# Patient Record
Sex: Female | Born: 1995 | Race: Black or African American | Hispanic: No | Marital: Single | Smoking: Never smoker
Health system: Southern US, Community
[De-identification: ages and names within clinical notes are randomized; demographics above are authoritative.]

---
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
## 2017-08-30 ENCOUNTER — Emergency Department (HOSPITAL_BASED_OUTPATIENT_CLINIC_OR_DEPARTMENT_OTHER): Payer: Medicaid Other

## 2017-08-30 ENCOUNTER — Encounter (HOSPITAL_BASED_OUTPATIENT_CLINIC_OR_DEPARTMENT_OTHER): Payer: Self-pay | Admitting: Emergency Medicine

## 2017-08-30 ENCOUNTER — Other Ambulatory Visit: Payer: Self-pay

## 2017-08-30 ENCOUNTER — Emergency Department (HOSPITAL_BASED_OUTPATIENT_CLINIC_OR_DEPARTMENT_OTHER)
Admission: EM | Admit: 2017-08-30 | Discharge: 2017-08-30 | Disposition: A | Discharge status: Home / Self Care | Payer: Medicaid Other | Attending: Emergency Medicine | Admitting: Emergency Medicine

## 2017-08-30 DIAGNOSIS — M25561 Pain in right knee: Secondary | ICD-10-CM | POA: Diagnosis present

## 2017-08-30 MED ORDER — NAPROXEN 500 MG PO TABS: 500.0000 mg | ORAL_TABLET | Freq: Two times a day (BID) | ORAL | 0 refills | Status: AC

## 2017-08-30 MED FILL — NAPROXEN 500 MG TABLET: 500 | 15 days supply | Qty: 30 | Fill #0

## 2017-08-30 NOTE — ED Provider Notes (Signed)
[facility] EMERGENCY DEPARTMENT Provider Note   CSN: [id number] Arrival date & time: 08/30/17  1030     History   Chief Complaint No chief complaint on file.   HPI Brianna Hood is a 22 y.o. female who presents the emergency department today for right knee pain times 2 months.  Patient states that over the last 2 months she notes anterior and medial joint pain that she describes as sharp, achy in nature is most severe when walking upstairs.  She notes the pain typically lasts for approximately 15 minutes and is relieved with rest.  She notes periods where she feels her knee is clicking/catching but never gives way on her.  She has not taken anything for her symptoms.  She denies over-the-counter medication, rest, ice, bracing or elevation.  Patient does note that she has 2 young children that she constantly bends down and picks up which she thinks may be aggravating her symptoms.  She denies any associated trauma, fever, overlying erythema, joint swelling, numbness/tingling/weakness.  HPI  History reviewed. No pertinent past medical history.  There are no active problems to display for this patient.   History reviewed. No pertinent surgical history.  OB History    No data available       Home Medications    Prior to Admission medications   Medication Sig Start Date End Date Taking? Authorizing Provider  Prenatal Vit-Fe Fumarate-FA (PRENATAL COMPLETE) 14-0.4 MG TABS Take 1 tablet by mouth daily. 10/26/14   Raymon Mutton, PA-C    Family History History reviewed. No pertinent family history.  Social History Social History   Tobacco Use  . Smoking status: Never Smoker  Substance Use Topics  . Alcohol use: No  . Drug use: No     Allergies   Patient has no known allergies.   Review of Systems Review of Systems  Musculoskeletal: Positive for arthralgias. Negative for gait problem and joint swelling.  Skin: Negative for color change and wound.    Neurological: Negative for weakness and numbness.  All other systems reviewed and are negative.    Physical Exam Updated Vital Signs BP 121/73 (BP Location: Left Arm)   Pulse 66   Temp 98.2 F (36.8 C) (Oral)   Resp 18   Ht 5\' 7"  (1.702 m)   Wt 92.5 kg (204 lb)   SpO2 100%   BMI 31.95 kg/m   Physical Exam  Constitutional: She appears well-developed and well-nourished.  HENT:  Head: Normocephalic and atraumatic.  Right Ear: External ear normal.  Left Ear: External ear normal.  Eyes: Conjunctivae are normal. Right eye exhibits no discharge. Left eye exhibits no discharge. No scleral icterus.  Cardiovascular:  Pulses:      Dorsalis pedis pulses are 2+ on the right side.       Posterior tibial pulses are 2+ on the right side.  No lower extremity edema or swelling bilaterally.  No tenderness palpation of the calves.  Pulmonary/Chest: Effort normal. No respiratory distress.  Musculoskeletal:  Right knee: Appearance normal. No obvious deformity. No skin swelling, erythema, heat, fluctuance or break of the skin. TTP over anterior and medial joint line. Active and passive flexion (70 degree's) and extension (180 degree's) intact without crepitus. Pain noted on maximal flexion past 90 degree's active and passively. Negative Lachman's test. Negative anterior/poster drawer bilaterally. Positive medial McMurray's test. Negative ballottement test. No varus or valgus laxity or locking. No TTP of hips or ankles. Compartments soft. Neurovascularly intact distally to site of  injury.  Neurological: She is alert. She has normal strength. She displays no atrophy. No sensory deficit.  Skin: Skin is warm and dry. Capillary refill takes less than 2 seconds. She is not diaphoretic. No erythema. No pallor.  No overlying joint erythema or heat.  Psychiatric: She has a normal mood and affect.  Nursing note and vitals reviewed.    ED Treatments / Results  Labs (all labs ordered are listed, but only  abnormal results are displayed) Labs Reviewed - No data to display  EKG  EKG Interpretation None       Radiology Dg Knee Complete 4 Views Right  Result Date: 08/30/2017 CLINICAL DATA:  Five months of right knee pain with no known injury. Increasing severity since last night. EXAM: RIGHT KNEE - COMPLETE 4+ VIEW COMPARISON:  None in PACs FINDINGS: The bones are subjectively adequately mineralized. There is no acute or healing fracture. The joint spaces are well maintained. There is mild beaking of the medial tibial spine. IMPRESSION: There is no acute bony abnormality of the right knee. Minimal degenerative change manifested by beaking of the medial tibial spine is observed. Electronically Signed   By: David  [place]azil[place] M.D.   On: 08/30/2017 11:17    Procedures Procedures (including critical care time)  Medications Ordered in ED Medications - No data to display   Initial Impression / Assessment and Plan / ED Course  I have reviewed the triage vital signs and the nursing notes.  Pertinent labs & imaging results that were available during my care of the patient were reviewed by me and considered in my medical decision making (see chart for details).     22 y.o. female with anterior and medial knee pain worse when walking up stairs, as well as clicking and catching of the knee when ambulating, No trauma, fever, or paresthesia's. Exam is concerning for meniscus injury with + McMurray's test medial. Patient exam not concerning for septic joint as the patient is without fever, decreased rom, overlying erythema or joint swelling. Do not suspect DVT given patient's clinical picture.  Patient is neurovascular intact.  No history of dislocation/relocation injury that make me concern for vascular injury.  Xray no fracture or healing fracture.  No evidence of osteoarthritis.  There is mild breakdown of the medial tibial spine.  Placed the patient in knee sleeve and recommend conservative therapy  including PRICE therapy. I advised the patient to follow-up with PCP vs ortho this week. Specific return precautions discussed. Time was given for all questions to be answered. The patient verbalized understanding and agreement with plan. The patient appears safe for discharge home.  Final Clinical Impressions(s) / ED Diagnoses   Final diagnoses:  Acute pain of right knee    ED Discharge Orders        Ordered    naproxen (NAPROSYN) 500 MG tablet  2 times daily     08/30/17 1126       Princella PellegriniMaczis, Niley Helbig M, PA-C 08/30/17 1127    Charlynne PanderYao, David Hsienta, MD 08/30/17 1539

## 2017-08-30 NOTE — ED Triage Notes (Signed)
Patient states that she has had pain to her Right knee for 2 months. Last night it was worse

## 2017-08-30 NOTE — Discharge Instructions (Signed)
Please read and follow all provided instructions.  You have been seen today for right knee pain  Tests performed today include: An x-ray of the affected area - does NOT show any broken bones or dislocations.  Is possible your symptoms may be related to a meniscus injury.  Is unclear if this is a tear or minor injury at this time. Vital signs. See below for your results today.   Home care instructions: -- *PRICE in the first 24-48 hours after injury: Protect (with brace, splint, sling), if given by your provider Rest Ice- Do not apply ice pack directly to your skin, place towel or similar between your skin and ice/ice pack. Apply ice for 20 min, then remove for 40 min while awake Compression- Wear brace, elastic bandage, splint as directed by your provider Elevate affected extremity above the level of your heart when not walking around for the first 24-48 hours   Take naproxen as directed with food.  Please do not use if you are breast-feeding or think he may be pregnant.  Follow-up instructions: Please follow-up with your primary care provider or the provided orthopedic physician (bone specialist) if you continue to have significant pain in 1 week. In this case you may have a more severe injury that requires further care.   Return instructions:  Please return if your toes or feet are numb or tingling, appear gray or blue, or you have severe pain (also elevate the leg and loosen splint or wrap if you were given one) Please return to the Emergency Department if you experience worsening symptoms.  Please return if you have any other emergent concerns. Additional Information:  Your vital signs today were: BP 121/73 (BP Location: Left Arm)    Pulse 66    Temp 98.2 F (36.8 C) (Oral)    Resp 18    Ht 5\' 7"  (1.702 m)    Wt 92.5 kg (204 lb)    SpO2 100%    BMI 31.95 kg/m  If your blood pressure (BP) was elevated above 135/85 this visit, please have this repeated by your doctor within one  month. ---------------

## 2017-11-02 ENCOUNTER — Other Ambulatory Visit (INDEPENDENT_AMBULATORY_CARE_PROVIDER_SITE_OTHER): Payer: Medicaid Other

## 2017-11-02 DIAGNOSIS — Z111 Encounter for screening for respiratory tuberculosis: Secondary | ICD-10-CM | POA: Diagnosis not present

## 2017-11-02 NOTE — Progress Notes (Signed)
Tuberculin skin test applied to left ventral forearm.  Patient informed to schedule appt for nurse visit in 48-72 hours to have site read.  Jay'A R Adysen Raphael, RMA  

## 2017-11-02 NOTE — Patient Instructions (Signed)
Please return to the clinic in 48-72 hours to have ppd read  Tuberculin purified protein derivative, PPD injection What is this medicine? TUBERCULIN PURIFIED PROTEIN DERIVATIVE, PPD is a test used to detect if you have a tuberculosis infection. It will not cause tuberculosis infection. This medicine may be used for other purposes; ask your health care provider or pharmacist if you have questions. COMMON BRAND NAME(S): Aplisol, Tubersol What should I tell my health care provider before I take this medicine? They need to know if you have any of these conditions: -diabetes -HIV or AIDS -immune system problems -infection (especially a virus infection such as chickenpox, cold sores, or herpes) -kidney disease -malnutrition -an unusual or allergic reaction to tuberculin purified protein derivative, PPD, other medicines, foods, dyes, or preservatives -pregnant or trying to get pregnant -breast-feeding How should I use this medicine? This medicine is for injection in the skin. It is given by a health care professional in a hospital or clinic setting. Talk to your pediatrician regarding the use of this medicine in children. While this drug may be prescribed in children, precautions do apply. Overdosage: If you think you have taken too much of this medicine contact a poison control center or emergency room at once. NOTE: This medicine is only for you. Do not share this medicine with others. What if I miss a dose? It is important not to miss your dose. Call your doctor or health care professional if you are unable to keep an appointment. What may interact with this medicine? -adalimumab -anakinra -etanercept -infliximab -live virus vaccines -medicines to treat cancer -steroid medicines like prednisone or cortisone This list may not describe all possible interactions. Give your health care provider a list of all the medicines, herbs, non-prescription drugs, or dietary supplements you use. Also  tell them if you smoke, drink alcohol, or use illegal drugs. Some items may interact with your medicine. What should I watch for while using this medicine? See your health care provider as directed. This medicine does not protect against tuberculosis. What side effects may I notice from receiving this medicine? Side effects that you should report to your doctor or health care professional as soon as possible: -allergic reactions like skin rash, itching or hives, swelling of the face, lips, or tongue -breathing problems Side effects that usually do not require medical attention (report to your doctor or health care professional if they continue or are bothersome): -bruising -pain, redness, or irritation at site where injected This list may not describe all possible side effects. Call your doctor for medical advice about side effects. You may report side effects to FDA at 1-800-FDA-1088. Where should I keep my medicine? This drug is given in a hospital or clinic and will not be stored at home. NOTE: This sheet is a summary. It may not cover all possible information. If you have questions about this medicine, talk to your doctor, pharmacist, or health care provider.  2018 Elsevier/Gold Standard (2015-07-24 11:42:34)

## 2017-11-04 ENCOUNTER — Other Ambulatory Visit (INDEPENDENT_AMBULATORY_CARE_PROVIDER_SITE_OTHER): Payer: Medicaid Other

## 2018-07-04 ENCOUNTER — Encounter (HOSPITAL_BASED_OUTPATIENT_CLINIC_OR_DEPARTMENT_OTHER): Payer: Self-pay | Admitting: *Deleted

## 2018-07-04 ENCOUNTER — Emergency Department (HOSPITAL_BASED_OUTPATIENT_CLINIC_OR_DEPARTMENT_OTHER)
Admission: EM | Admit: 2018-07-04 | Discharge: 2018-07-04 | Disposition: A | Discharge status: Home / Self Care | Payer: BLUE CROSS/BLUE SHIELD | Attending: Emergency Medicine | Admitting: Emergency Medicine

## 2018-07-04 ENCOUNTER — Other Ambulatory Visit: Payer: Self-pay

## 2018-07-04 ENCOUNTER — Emergency Department (HOSPITAL_BASED_OUTPATIENT_CLINIC_OR_DEPARTMENT_OTHER): Payer: BLUE CROSS/BLUE SHIELD

## 2018-07-04 DIAGNOSIS — R1084 Generalized abdominal pain: Secondary | ICD-10-CM | POA: Insufficient documentation

## 2018-07-04 DIAGNOSIS — Z79899 Other long term (current) drug therapy: Secondary | ICD-10-CM | POA: Diagnosis not present

## 2018-07-04 DIAGNOSIS — B373 Candidiasis of vulva and vagina: Secondary | ICD-10-CM

## 2018-07-04 DIAGNOSIS — R1012 Left upper quadrant pain: Secondary | ICD-10-CM | POA: Diagnosis present

## 2018-07-04 DIAGNOSIS — N898 Other specified noninflammatory disorders of vagina: Secondary | ICD-10-CM | POA: Diagnosis not present

## 2018-07-04 DIAGNOSIS — O2341 Unspecified infection of urinary tract in pregnancy, first trimester: Secondary | ICD-10-CM

## 2018-07-04 DIAGNOSIS — O23591 Infection of other part of genital tract in pregnancy, first trimester: Secondary | ICD-10-CM | POA: Insufficient documentation

## 2018-07-04 DIAGNOSIS — Z331 Pregnant state, incidental: Secondary | ICD-10-CM | POA: Diagnosis not present

## 2018-07-04 DIAGNOSIS — O219 Vomiting of pregnancy, unspecified: Secondary | ICD-10-CM

## 2018-07-04 DIAGNOSIS — B3731 Acute candidiasis of vulva and vagina: Secondary | ICD-10-CM

## 2018-07-04 LAB — COMPREHENSIVE METABOLIC PANEL
ALT: 10 U/L (ref 0–44)
AST: 12 U/L — ABNORMAL LOW (ref 15–41)
Albumin: 3.8 g/dL (ref 3.5–5.0)
Alkaline Phosphatase: 75 U/L (ref 38–126)
Anion gap: 7 (ref 5–15)
BUN: 9 mg/dL (ref 6–20)
CALCIUM: 9 mg/dL (ref 8.9–10.3)
CO2: 20 mmol/L — AB (ref 22–32)
Chloride: 106 mmol/L (ref 98–111)
Creatinine, Ser: 0.66 mg/dL (ref 0.44–1.00)
GFR calc non Af Amer: >60 mL/min (ref >60)
Glucose, Bld: 85 mg/dL (ref 70–99)
POTASSIUM: 3.8 mmol/L (ref 3.5–5.1)
SODIUM: 133 mmol/L — AB (ref 135–145)
Total Bilirubin: 0.4 mg/dL (ref 0.3–1.2)
Total Protein: 7.5 g/dL (ref 6.5–8.1)

## 2018-07-04 LAB — CBC WITH DIFFERENTIAL/PLATELET
Abs Immature Granulocytes: 0.01 10*3/uL (ref 0.00–0.07)
Basophils Absolute: 0 10*3/uL (ref 0.0–0.1)
Basophils Relative: 0 %
EOS ABS: 0 10*3/uL (ref 0.0–0.5)
Eosinophils Relative: 0 %
HCT: 36.8 % (ref 36.0–46.0)
Hemoglobin: 11.5 g/dL — ABNORMAL LOW (ref 12.0–15.0)
Immature Granulocytes: 0 %
Lymphocytes Relative: 19 %
Lymphs Abs: 1.3 10*3/uL (ref 0.7–4.0)
MCH: 24.8 pg — ABNORMAL LOW (ref 26.0–34.0)
MCHC: 31.3 g/dL (ref 30.0–36.0)
MCV: 79.5 fL — ABNORMAL LOW (ref 80.0–100.0)
Monocytes Absolute: 0.4 10*3/uL (ref 0.1–1.0)
Monocytes Relative: 6 %
NEUTROS PCT: 75 %
NRBC: 0 % (ref 0.0–0.2)
Neutro Abs: 5.2 10*3/uL (ref 1.7–7.7)
Platelets: 181 10*3/uL (ref 150–400)
RBC: 4.63 MIL/uL (ref 3.87–5.11)
RDW: 14.7 % (ref 11.5–15.5)
WBC: 6.9 10*3/uL (ref 4.0–10.5)

## 2018-07-04 LAB — URINALYSIS, ROUTINE W REFLEX MICROSCOPIC
Bilirubin Urine: NEGATIVE
Glucose, UA: NEGATIVE mg/dL
Ketones, ur: NEGATIVE mg/dL
Nitrite: POSITIVE — AB
PH: 7 (ref 5.0–8.0)
Protein, ur: NEGATIVE mg/dL
SPECIFIC GRAVITY, URINE: 1.015 (ref 1.005–1.030)

## 2018-07-04 LAB — URINALYSIS, MICROSCOPIC (REFLEX)

## 2018-07-04 LAB — WET PREP, GENITAL
SPERM: NONE SEEN
Trich, Wet Prep: NONE SEEN

## 2018-07-04 LAB — PREGNANCY, URINE: Preg Test, Ur: POSITIVE — AB

## 2018-07-04 LAB — LIPASE, BLOOD: Lipase: 25 U/L (ref 11–51)

## 2018-07-04 LAB — HCG, QUANTITATIVE, PREGNANCY: hCG, Beta Chain, Quant, S: 61037 m[IU]/mL — ABNORMAL HIGH (ref <5)

## 2018-07-04 MED ORDER — CLOTRIMAZOLE 1 % VA CREA: 1.0000 | TOPICAL_CREAM | Freq: Every day | VAGINAL | 0 refills | Status: AC

## 2018-07-04 MED ORDER — CEPHALEXIN 500 MG PO CAPS: 500.0000 mg | ORAL_CAPSULE | Freq: Four times a day (QID) | ORAL | 0 refills | Status: AC

## 2018-07-04 NOTE — ED Provider Notes (Signed)
[facility] EMERGENCY DEPARTMENT Provider Note   CSN: [id number] Arrival date & time: 07/04/18  1543     History   Chief Complaint Chief Complaint  Patient presents with  . Abdominal Pain  . Emesis    HPI Brianna Hood is a 22 y.o. female.  22 year old female presents with complaint of left upper quadrant abdominal pain with nausea and vomiting x2 days as well as thick white vaginal discharge.  Abdominal pain is aching in nature, intermittent, nothing makes pain better or worse, does not radiate.  Patient denies vaginal itching or sores.  Last menstrual cycle reported to be greater than 4 weeks ago.  No other complaints or concerns.     History reviewed. No pertinent past medical history.  There are no active problems to display for this patient.   History reviewed. No pertinent surgical history.   OB History   No obstetric history on file.      Home Medications    Prior to Admission medications   Medication Sig Start Date End Date Taking? Authorizing Provider  cephALEXin (KEFLEX) 500 MG capsule Take 1 capsule (500 mg total) by mouth 4 (four) times daily for 5 days. 07/04/18 07/09/18  Jeannie Fend, PA-C  clotrimazole (GYNE-LOTRIMIN) 1 % vaginal cream Place 1 Applicatorful vaginally at bedtime for 7 days. 07/04/18 07/11/18  Jeannie Fend, PA-C  naproxen (NAPROSYN) 500 MG tablet Take 1 tablet (500 mg total) by mouth 2 (two) times daily. 08/30/17   Maczis, Elmer Sow, PA-C  Prenatal Vit-Fe Fumarate-FA (PRENATAL COMPLETE) 14-0.4 MG TABS Take 1 tablet by mouth daily. 10/26/14   Raymon Mutton, PA-C    Family History No family history on file.  Social History Social History   Tobacco Use  . Smoking status: Never Smoker  . Smokeless tobacco: Never Used  Substance Use Topics  . Alcohol use: No  . Drug use: No     Allergies   Patient has no known allergies.   Review of Systems Review of Systems  Constitutional: Negative for fever.  Respiratory:  Negative for shortness of breath.   Cardiovascular: Negative for chest pain.  Gastrointestinal: Positive for abdominal pain, nausea and vomiting. Negative for abdominal distention, constipation and diarrhea.  Genitourinary: Positive for vaginal discharge. Negative for dysuria, frequency and urgency.  Musculoskeletal: Negative for back pain.  Skin: Negative for rash and wound.  Allergic/Immunologic: Negative for immunocompromised state.  Hematological: Negative for adenopathy. Does not bruise/bleed easily.  Psychiatric/Behavioral: Negative for confusion.  All other systems reviewed and are negative.    Physical Exam Updated Vital Signs BP (!) 118/56 (BP Location: Right Arm)   Pulse 76   Temp 98.5 F (36.9 C) (Oral)   Resp 18   Ht 5\' 7"  (1.702 m)   Wt 92.5 kg   LMP 05/05/2018   SpO2 100%   BMI 31.95 kg/m   Physical Exam Vitals signs and nursing note reviewed. Exam conducted with a chaperone present.  Constitutional:      Appearance: She is well-developed.  HENT:     Head: Normocephalic and atraumatic.  Cardiovascular:     Rate and Rhythm: Normal rate and regular rhythm.     Heart sounds: Normal heart sounds. No murmur.  Pulmonary:     Effort: Pulmonary effort is normal.     Breath sounds: Normal breath sounds.  Abdominal:     Palpations: Abdomen is soft.     Tenderness: There is abdominal tenderness in the right lower quadrant, suprapubic area, left  upper quadrant and left lower quadrant. There is no right CVA tenderness or left CVA tenderness.  Genitourinary:    Vagina: Vaginal discharge present.     Cervix: No friability.     Uterus: Normal.      Adnexa: Right adnexa normal and left adnexa normal.       Right: No tenderness.         Left: No tenderness.       Comments: Moderate thick white/yellow dc present.  Skin:    General: Skin is warm and dry.  Neurological:     Mental Status: She is alert.      ED Treatments / Results  Labs (all labs ordered are  listed, but only abnormal results are displayed) Labs Reviewed  WET PREP, GENITAL - Abnormal; Notable for the following components:      Result Value   Yeast Wet Prep HPF POC PRESENT (*)    Clue Cells Wet Prep HPF POC PRESENT (*)    WBC, Wet Prep HPF POC MANY (*)    All other components within normal limits  URINALYSIS, ROUTINE W REFLEX MICROSCOPIC - Abnormal; Notable for the following components:   Hgb urine dipstick TRACE (*)    Nitrite POSITIVE (*)    Leukocytes, UA TRACE (*)    All other components within normal limits  PREGNANCY, URINE - Abnormal; Notable for the following components:   Preg Test, Ur POSITIVE (*)    All other components within normal limits  URINALYSIS, MICROSCOPIC (REFLEX) - Abnormal; Notable for the following components:   Bacteria, UA MANY (*)    All other components within normal limits  COMPREHENSIVE METABOLIC PANEL - Abnormal; Notable for the following components:   Sodium 133 (*)    CO2 20 (*)    AST 12 (*)    All other components within normal limits  CBC WITH DIFFERENTIAL/PLATELET - Abnormal; Notable for the following components:   Hemoglobin 11.5 (*)    MCV 79.5 (*)    MCH 24.8 (*)    All other components within normal limits  HCG, QUANTITATIVE, PREGNANCY - Abnormal; Notable for the following components:   hCG, Beta Chain, Quant, S 61,037 (*)    All other components within normal limits  LIPASE, BLOOD  GC/CHLAMYDIA PROBE AMP ([place]) NOT AT [facility]    EKG None  Radiology [place] Ob Comp < 14 Wks  Result Date: 07/04/2018 CLINICAL DATA:  Abdomen pain for 3 days. EXAM: OBSTETRIC <14 WK US AND TRANSVAGINAL OB US TECHNIQUE: Both transabdominal and transvaginal ultrasound examinations were performed for complete evaluation of the gestation as well as the maternal uterus, adnexal regions, and pelvic cul-de-sac. Transvaginal technique was performed to assess early pregnancy. COMPARISON:  None. FINDINGS: Intrauterine gestational sac: Single Yolk sac:   Visualized. Embryo:  Visualized. Cardiac Activity: Visualized. Heart Rate: 116 bpm MSD:   mm    w     d CRL:  5.2 mm   6 w   2 d                  US EDC: March 04, 2019 Subchorionic hemorrhage:  Mild subchorionic hemorrhage is noted. Maternal uterus/adnexae: Corpus luteal cyst in the right ovary. The left ovary is normal. There is mild increased vascularity surrounding left ovary. Moderate free fluid noted. IMPRESSION: Single live intrauterine pregnancy measuring to 6 weeks and 2 days. There is mild subchorionic hemorrhage. Moderate free fluid is identified in the pelvis. Electronically Signed   By: Scherry RanWei-Chen  Juel BurrowLin M.D.   On: 07/04/2018 19:32   [place] Ob Transvaginal  Result Date: 07/04/2018 CLINICAL DATA:  Abdomen pain for 3 days. EXAM: OBSTETRIC <14 WK US AND TRANSVAGINAL OB US TECHNIQUE: Both transabdominal and transvaginal ultrasound examinations were performed for complete evaluation of the gestation as well as the maternal uterus, adnexal regions, and pelvic cul-de-sac. Transvaginal technique was performed to assess early pregnancy. COMPARISON:  None. FINDINGS: Intrauterine gestational sac: Single Yolk sac:  Visualized. Embryo:  Visualized. Cardiac Activity: Visualized. Heart Rate: 116 bpm MSD:   mm    w     d CRL:  5.2 mm   6 w   2 d                  US EDC: March 04, 2019 Subchorionic hemorrhage:  Mild subchorionic hemorrhage is noted. Maternal uterus/adnexae: Corpus luteal cyst in the right ovary. The left ovary is normal. There is mild increased vascularity surrounding left ovary. Moderate free fluid noted. IMPRESSION: Single live intrauterine pregnancy measuring to 6 weeks and 2 days. There is mild subchorionic hemorrhage. Moderate free fluid is identified in the pelvis. Electronically Signed   By: Sherian ReinWei-Chen  Lin M.D.   On: 07/04/2018 19:32    Procedures Procedures (including critical care time)  Medications Ordered in ED Medications - No data to display   Initial Impression / Assessment and  Plan / ED Course  I have reviewed the triage vital signs and the nursing notes.  Pertinent labs & imaging results that were available during my care of the patient were reviewed by me and considered in my medical decision making (see chart for details).  Clinical Course as of Jul 04 2000  Tue Jul 04, 2018  [id number] 22 year old female with complaint of left upper quadrant abdominal pain with nausea and vomiting and thick white vaginal discharge.  Patient was found to be pregnant, no pelvic tenderness on exam, thick white discharge present.  Wet prep is positive for yeast and clue cells.  Urinalysis concerning for UTI, CBC and CMP unremarkable, quant correlates with pregnancy, gestational age [redacted] weeks and 2 days found on ultrasound showing viable IUP.  Patient will be treated with Keflex for her UTI, clotrimazole for her yeast infection.  Recommend follow-up with OB for routine prenatal care and recheck of the BV.   [LM]    Clinical Course User Index [LM] Jeannie FendMurphy, Anistyn Graddy A, PA-C   Final Clinical Impressions(s) / ED Diagnoses   Final diagnoses:  Pregnant state, incidental  Generalized abdominal pain  Nausea and vomiting in pregnancy  Urinary tract infection in mother during first trimester of pregnancy  Vaginal yeast infection    ED Discharge Orders         Ordered    cephALEXin (KEFLEX) 500 MG capsule  4 times daily     07/04/18 1918    clotrimazole (GYNE-LOTRIMIN) 1 % vaginal cream  Daily at bedtime     07/04/18 1933           Alden HippMurphy, Astra Gregg A, PA-C 07/04/18 Stann [id number]    Plunkett, Whitney, MD 07/06/18 2139

## 2018-07-04 NOTE — Discharge Instructions (Addendum)
Take Keflex as prescribed and complete the full course. Take Clotrimazole as prescribed. Take a prenatal vitamin daily. Take a vitamin B6 with a Unisom tablet to help with nausea and vomiting.  Sip on ginger ale. Follow-up with your OB for further pregnancy care.

## 2018-07-04 NOTE — ED Triage Notes (Signed)
Abdominal and vomiting x 3 days. Vaginal discharge.

## 2018-07-07 LAB — GC/CHLAMYDIA PROBE AMP (~~LOC~~) NOT AT ARMC
Chlamydia: NEGATIVE
Neisseria Gonorrhea: NEGATIVE

## 2019-09-12 ENCOUNTER — Other Ambulatory Visit: Payer: Self-pay

## 2019-09-12 ENCOUNTER — Encounter (HOSPITAL_BASED_OUTPATIENT_CLINIC_OR_DEPARTMENT_OTHER): Payer: Self-pay | Admitting: Emergency Medicine

## 2019-09-12 ENCOUNTER — Emergency Department (HOSPITAL_BASED_OUTPATIENT_CLINIC_OR_DEPARTMENT_OTHER)
Admission: EM | Admit: 2019-09-12 | Discharge: 2019-09-12 | Disposition: A | Discharge status: Home / Self Care | Payer: BC Managed Care – PPO | Attending: Emergency Medicine | Admitting: Emergency Medicine

## 2019-09-12 DIAGNOSIS — H1131 Conjunctival hemorrhage, right eye: Secondary | ICD-10-CM | POA: Diagnosis not present

## 2019-09-12 DIAGNOSIS — S0501XA Injury of conjunctiva and corneal abrasion without foreign body, right eye, initial encounter: Secondary | ICD-10-CM | POA: Diagnosis not present

## 2019-09-12 DIAGNOSIS — Y9289 Other specified places as the place of occurrence of the external cause: Secondary | ICD-10-CM | POA: Insufficient documentation

## 2019-09-12 DIAGNOSIS — Y9389 Activity, other specified: Secondary | ICD-10-CM | POA: Diagnosis not present

## 2019-09-12 DIAGNOSIS — Y999 Unspecified external cause status: Secondary | ICD-10-CM | POA: Insufficient documentation

## 2019-09-12 DIAGNOSIS — W504XXA Accidental scratch by another person, initial encounter: Secondary | ICD-10-CM | POA: Diagnosis not present

## 2019-09-12 DIAGNOSIS — S0591XA Unspecified injury of right eye and orbit, initial encounter: Secondary | ICD-10-CM | POA: Diagnosis present

## 2019-09-12 MED ORDER — ERYTHROMYCIN 5 MG/GM OP OINT: TOPICAL_OINTMENT | OPHTHALMIC | 0 refills | Status: AC

## 2019-09-12 MED ORDER — FLUORESCEIN SODIUM 1 MG OP STRP
1.0000 | ORAL_STRIP | Freq: Once | OPHTHALMIC | Status: AC
  Administered 2019-09-12: 1 via OPHTHALMIC
  Filled 2019-09-12: qty 1

## 2019-09-12 MED ORDER — TETRACAINE HCL 0.5 % OP SOLN
2.0000 [drp] | Freq: Once | OPHTHALMIC | Status: AC
  Administered 2019-09-12: 2 [drp] via OPHTHALMIC
  Filled 2019-09-12: qty 4

## 2019-09-12 NOTE — Discharge Instructions (Addendum)
Use the eye antibiotics as prescribed. Follow up with Opthalmology in 3-4 days for reevaluation.
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
## 2021-09-04 ENCOUNTER — Encounter (HOSPITAL_BASED_OUTPATIENT_CLINIC_OR_DEPARTMENT_OTHER): Payer: Self-pay | Admitting: *Deleted

## 2021-09-04 ENCOUNTER — Emergency Department (HOSPITAL_BASED_OUTPATIENT_CLINIC_OR_DEPARTMENT_OTHER)
Admission: EM | Admit: 2021-09-04 | Discharge: 2021-09-04 | Disposition: A | Discharge status: Home / Self Care | Payer: Medicaid Other | Attending: Emergency Medicine | Admitting: Emergency Medicine

## 2021-09-04 ENCOUNTER — Emergency Department (HOSPITAL_BASED_OUTPATIENT_CLINIC_OR_DEPARTMENT_OTHER): Payer: Medicaid Other

## 2021-09-04 ENCOUNTER — Other Ambulatory Visit: Payer: Self-pay

## 2021-09-04 DIAGNOSIS — A5901 Trichomonal vulvovaginitis: Secondary | ICD-10-CM | POA: Insufficient documentation

## 2021-09-04 DIAGNOSIS — Z3491 Encounter for supervision of normal pregnancy, unspecified, first trimester: Secondary | ICD-10-CM

## 2021-09-04 DIAGNOSIS — Z79899 Other long term (current) drug therapy: Secondary | ICD-10-CM | POA: Diagnosis not present

## 2021-09-04 DIAGNOSIS — Z3201 Encounter for pregnancy test, result positive: Secondary | ICD-10-CM | POA: Diagnosis not present

## 2021-09-04 DIAGNOSIS — N898 Other specified noninflammatory disorders of vagina: Secondary | ICD-10-CM

## 2021-09-04 DIAGNOSIS — O23599 Infection of other part of genital tract in pregnancy, unspecified trimester: Secondary | ICD-10-CM | POA: Insufficient documentation

## 2021-09-04 DIAGNOSIS — M545 Low back pain, unspecified: Secondary | ICD-10-CM | POA: Insufficient documentation

## 2021-09-04 DIAGNOSIS — Z3490 Encounter for supervision of normal pregnancy, unspecified, unspecified trimester: Secondary | ICD-10-CM

## 2021-09-04 LAB — URINALYSIS, ROUTINE W REFLEX MICROSCOPIC
Bilirubin Urine: NEGATIVE
Glucose, UA: NEGATIVE mg/dL
Ketones, ur: NEGATIVE mg/dL
Nitrite: NEGATIVE
Protein, ur: NEGATIVE mg/dL
Specific Gravity, Urine: >=1.03 (ref 1.005–1.030)
pH: 6 (ref 5.0–8.0)

## 2021-09-04 LAB — URINALYSIS, MICROSCOPIC (REFLEX)

## 2021-09-04 LAB — WET PREP, GENITAL
Clue Cells Wet Prep HPF POC: NONE SEEN
Sperm: NONE SEEN
WBC, Wet Prep HPF POC: >=10 — AB (ref <10)
Yeast Wet Prep HPF POC: NONE SEEN

## 2021-09-04 LAB — PREGNANCY, URINE: Preg Test, Ur: POSITIVE — AB

## 2021-09-04 LAB — HCG, QUANTITATIVE, PREGNANCY: hCG, Beta Chain, Quant, S: 63673 m[IU]/mL — ABNORMAL HIGH (ref <5)

## 2021-09-04 MED ORDER — METRONIDAZOLE 500 MG PO TABS: 500.0000 mg | ORAL_TABLET | Freq: Two times a day (BID) | ORAL | 0 refills | Status: DC

## 2021-09-04 MED ORDER — ACETAMINOPHEN 500 MG PO TABS
1000.0000 mg | ORAL_TABLET | Freq: Once | ORAL | Status: AC
Start: 2021-09-04 — End: 2021-09-04
  Administered 2021-09-04: 1000 mg via ORAL
  Filled 2021-09-04: qty 2

## 2021-09-04 NOTE — ED Notes (Signed)
Chaperone for EDP for pelvic exam. Pt tolerated well.  ?

## 2021-09-04 NOTE — ED Notes (Signed)
Patient transported to Ultrasound via wheelchair.

## 2021-09-04 NOTE — ED Triage Notes (Signed)
Vaginal discharge and back pain for a week.  ?

## 2021-09-04 NOTE — Discharge Instructions (Addendum)
It was our pleasure to provide your ER care today - we hope that you feel better. ? ?Drink plenty of fluids/stay well hydrated.  ? ?The vaginal swabs showed trichomonas - take antibiotic as prescribed.  The pregnancy test is positive.  The ultrasound was read as showing: Single live intrauterine pregnancy seen, estimated  ?gestational age is 7 weeks 2 days. There is moderate to large sized subchorionic bleed adjacent to the gestational sac measuring 4.4 x 2.2 cm.  ? ?Follow up with ob/gyn doctor in the coming week - call office to arrange appointment. Discuss above ultrasound results with them.  ? ?Return to ER if worse, new symptoms, fevers, new/severe pain, severe pelvic/abdominal pain, vaginal bleeding, or other concern.  ?

## 2021-09-04 NOTE — ED Provider Notes (Signed)
?[facility] EMERGENCY DEPARTMENT ?Provider Note ? ? ?CSN: [id number] ?Arrival date & time: 09/04/21  1338 ? ?  ? ?History ? ?Chief Complaint  ?Patient presents with  ? Vaginal Discharge  ? Back Pain  ? ? ?Symphony Demuro is a 26 y.o. female. ? ?Patient c/o vaginal discharge in the past week. Symptoms acute onset, mild-mod, persistent. Last period ~ 1.5-2 months ago. No abn bleeding. No pelvic or abd pain. No nausea/vomiting. No fever or chills. No recent antibiotic use. No known std exposure. Patient also notes intermittent low back pain in past few days, mild, dull, non radiating. No specific injury or strain recalled. No saddle area or leg numbness. No weakness. No problems w normal bowel or bladder fxn.  ? ?The history is provided by the patient and medical records.  ?Vaginal Discharge ?Associated symptoms: no abdominal pain, no dysuria, no fever and no vomiting   ?Back Pain ?Associated symptoms: no abdominal pain, no chest pain, no dysuria, no fever, no headaches and no pelvic pain   ? ?  ? ?Home Medications ?Prior to Admission medications   ?Medication Sig Start Date End Date Taking? Authorizing Provider  ?erythromycin ophthalmic ointment Place a 1/2 inch ribbon of ointment into the lower eyelid. 09/12/19   Henderly, Britni A, PA-C  ?naproxen (NAPROSYN) 500 MG tablet Take 1 tablet (500 mg total) by mouth 2 (two) times daily. 08/30/17   Maczis, Elmer Sow, PA-C  ?Prenatal Vit-Fe Fumarate-FA (PRENATAL COMPLETE) 14-0.4 MG TABS Take 1 tablet by mouth daily. 10/26/14   Raymon Mutton, PA-C  ?   ? ?Allergies    ?Patient has no known allergies.   ? ?Review of Systems   ?Review of Systems  ?Constitutional:  Negative for chills and fever.  ?HENT:  Negative for sore throat.   ?Eyes:  Negative for redness.  ?Respiratory:  Negative for shortness of breath.   ?Cardiovascular:  Negative for chest pain.  ?Gastrointestinal:  Negative for abdominal pain and vomiting.  ?Genitourinary:  Positive for vaginal discharge.  Negative for dysuria, flank pain, hematuria and pelvic pain.  ?Musculoskeletal:  Positive for back pain.  ?Skin:  Negative for rash.  ?Neurological:  Negative for headaches.  ?Hematological:  Does not bruise/bleed easily.  ?Psychiatric/Behavioral:  Negative for confusion.   ? ?Physical Exam ?Updated Vital Signs ?BP (!) 111/57 (BP Location: Right Arm)   Pulse 72   Temp 98.2 ?F (36.8 ?C) (Oral)   Resp 16   Ht 1.727 m (5\' 8" )   Wt 93.7 kg   SpO2 100%   BMI 31.41 kg/m?  ?Physical Exam ?Vitals and nursing note reviewed.  ?Constitutional:   ?   Appearance: Normal appearance. She is well-developed.  ?HENT:  ?   Head: Atraumatic.  ?   Nose: Nose normal.  ?   Mouth/Throat:  ?   Mouth: Mucous membranes are moist.  ?Eyes:  ?   General: No scleral icterus. ?   Conjunctiva/sclera: Conjunctivae normal.  ?Neck:  ?   Trachea: No tracheal deviation.  ?Cardiovascular:  ?   Rate and Rhythm: Regular rhythm.  ?   Heart sounds: Normal heart sounds.  ?Pulmonary:  ?   Effort: Pulmonary effort is normal. No respiratory distress.  ?Abdominal:  ?   General: There is no distension.  ?   Palpations: Abdomen is soft.  ?   Tenderness: There is no abdominal tenderness.  ?Genitourinary: ?   Comments: No cva tenderness. Chaperoned pelvic exam w rn - mild whitish d/c, no cmt, cervix  closed, no blood. No adx mass/tenderness.  ?Musculoskeletal:     ?   General: No swelling.  ?   Cervical back: Neck supple. No muscular tenderness.  ?   Right lower leg: No edema.  ?   Left lower leg: No edema.  ?Skin: ?   General: Skin is warm and dry.  ?   Findings: No rash.  ?Neurological:  ?   Mental Status: She is alert.  ?   Comments: Alert, speech normal.   ?Psychiatric:     ?   Mood and Affect: Mood normal.  ? ? ?ED Results / Procedures / Treatments   ?Labs ?(all labs ordered are listed, but only abnormal results are displayed) ?Results for orders placed or performed during the hospital encounter of 09/04/21  ?Wet prep, genital  ? Specimen: Vaginal; Genital   ?Result Value Ref Range  ? Yeast Wet Prep HPF POC NONE SEEN NONE SEEN  ? Trich, Wet Prep PRESENT (A) NONE SEEN  ? Clue Cells Wet Prep HPF POC NONE SEEN NONE SEEN  ? WBC, Wet Prep HPF POC >=10 (A) <10  ? Sperm NONE SEEN   ?Pregnancy, urine  ?Result Value Ref Range  ? Preg Test, Ur POSITIVE (A) NEGATIVE  ?Urinalysis, Routine w reflex microscopic Urine, Clean Catch  ?Result Value Ref Range  ? Color, Urine YELLOW YELLOW  ? APPearance HAZY (A) CLEAR  ? Specific Gravity, Urine >=1.030 1.005 - 1.030  ? pH 6.0 5.0 - 8.0  ? Glucose, UA NEGATIVE NEGATIVE mg/dL  ? Hgb urine dipstick TRACE (A) NEGATIVE  ? Bilirubin Urine NEGATIVE NEGATIVE  ? Ketones, ur NEGATIVE NEGATIVE mg/dL  ? Protein, ur NEGATIVE NEGATIVE mg/dL  ? Nitrite NEGATIVE NEGATIVE  ? Leukocytes,Ua SMALL (A) NEGATIVE  ?Urinalysis, Microscopic (reflex)  ?Result Value Ref Range  ? RBC / HPF 0-5 0 - 5 RBC/hpf  ? WBC, UA 6-10 0 - 5 WBC/hpf  ? Bacteria, UA MANY (A) NONE SEEN  ? Squamous Epithelial / LPF 6-10 0 - 5  ? Mucus PRESENT   ? Trichomonas, UA PRESENT (A) NONE SEEN  ? ? ? ?EKG ?None ? ?Radiology ?US OB LESS THAN 14 WEEKS WITH OB TRANSVAGINAL ? ?Result Date: 09/04/2021 ?CLINICAL DATA:  Pelvic pain EXAM: OBSTETRIC <14 WK [place] AND TRANSVAGINAL OB US DOPPLER ULTRASOUND OF OVARIES TECHNIQUE: Both transabdominal and transvaginal ultrasound examinations were performed for complete evaluation of the gestation as well as the maternal uterus, adnexal regions, and pelvic cul-de-sac. Transvaginal technique was performed to assess early pregnancy. Color and duplex Doppler ultrasound was utilized to evaluate blood flow to the ovaries. COMPARISON:  None. FINDINGS: Intrauterine gestational sac: Single Yolk sac:  Seen Embryo:  Seen Cardiac Activity: Seen Heart Rate: 137 bpm CRL:   11 mm   7 w 2 d                  [place] EDC: 04/21/2022 Subchorionic hemorrhage: There is moderate to large sized subchorionic bleed measuring 4.4 x 2.2 cm Maternal uterus/adnexae: Cervix is closed. Uterus is  retroverted. Ovaries not sonographically visualized. IMPRESSION: Single live intrauterine pregnancy seen. Sonographically estimated gestational age is 7 weeks 2 days. There is moderate to large sized subchorionic bleed adjacent to the gestational sac measuring 4.4 x 2.2 cm. Ovaries are not sonographically visualized. Electronically Signed   By: Ernie Avena M.D.   On: 09/04/2021 15:22   ? ?Procedures ?Procedures  ? ? ?Medications Ordered in ED ?Medications  ?acetaminophen (TYLENOL) tablet 1,000 mg (1,000  mg Oral Given 09/04/21 1506)  ? ? ?ED Course/ Medical Decision Making/ A&P ?  ?                        ?Medical Decision Making ?Amount and/or Complexity of Data Reviewed ?Labs: ordered. ?Radiology: ordered. ? ?Risk ?OTC drugs. ? ? ?Labs sent.  ? ?Reviewed nursing notes and prior charts for additional history. External reports reviewed.  ? ?Labs reviewed/interpreted by me - preg test is positive.  Quant added. U/s added.  ? ?U/s reviewed/interpreted by me - + iup.  ? ?Recheck pt, no abd or pelvic pain. Abd soft non tender.  ? ?Rx provided. ? ?Rec close outpt ob/gyn f/u. ? ?Return precautions provided.  ? ? ? ? ? ? ? ? ? ?Final Clinical Impression(s) / ED Diagnoses ?Final diagnoses:  ?Positive pregnancy test  ?Vaginal discharge  ?Trichomonal vaginitis  ?Lumbar pain  ? ? ?Rx / DC Orders ?ED Discharge Orders   ? ? None  ? ?  ? ? ?  ?Cathren Laine, MD ?09/04/21 1531 ? ?

## 2021-09-04 NOTE — ED Notes (Signed)
Pt provided gown for examination, pelvic cart at bedside ?

## 2021-09-07 LAB — GC/CHLAMYDIA PROBE AMP (~~LOC~~) NOT AT ARMC
Chlamydia: NEGATIVE
Comment: NEGATIVE
Comment: NORMAL
Neisseria Gonorrhea: NEGATIVE

## 2021-10-16 ENCOUNTER — Encounter (HOSPITAL_BASED_OUTPATIENT_CLINIC_OR_DEPARTMENT_OTHER): Payer: Self-pay

## 2021-10-16 ENCOUNTER — Emergency Department (HOSPITAL_BASED_OUTPATIENT_CLINIC_OR_DEPARTMENT_OTHER)
Admission: EM | Admit: 2021-10-16 | Discharge: 2021-10-16 | Disposition: A | Discharge status: Home / Self Care | Payer: Medicaid Other | Attending: Emergency Medicine | Admitting: Emergency Medicine

## 2021-10-16 ENCOUNTER — Other Ambulatory Visit: Payer: Self-pay

## 2021-10-16 DIAGNOSIS — N760 Acute vaginitis: Secondary | ICD-10-CM | POA: Insufficient documentation

## 2021-10-16 DIAGNOSIS — N898 Other specified noninflammatory disorders of vagina: Secondary | ICD-10-CM | POA: Diagnosis present

## 2021-10-16 DIAGNOSIS — B9689 Other specified bacterial agents as the cause of diseases classified elsewhere: Secondary | ICD-10-CM | POA: Insufficient documentation

## 2021-10-16 LAB — WET PREP, GENITAL
Sperm: NONE SEEN
Trich, Wet Prep: NONE SEEN
WBC, Wet Prep HPF POC: <10 (ref <10)
Yeast Wet Prep HPF POC: NONE SEEN

## 2021-10-16 LAB — URINALYSIS, ROUTINE W REFLEX MICROSCOPIC
Bilirubin Urine: NEGATIVE
Glucose, UA: NEGATIVE mg/dL
Ketones, ur: NEGATIVE mg/dL
Leukocytes,Ua: NEGATIVE
Nitrite: NEGATIVE
Protein, ur: NEGATIVE mg/dL
Specific Gravity, Urine: >=1.03 (ref 1.005–1.030)
pH: 5.5 (ref 5.0–8.0)

## 2021-10-16 LAB — PREGNANCY, URINE: Preg Test, Ur: NEGATIVE

## 2021-10-16 LAB — URINALYSIS, MICROSCOPIC (REFLEX)

## 2021-10-16 MED ORDER — METRONIDAZOLE 500 MG PO TABS
500.0000 mg | ORAL_TABLET | Freq: Two times a day (BID) | ORAL | 0 refills | Status: DC
Start: 2021-10-16 — End: 2022-01-21

## 2021-10-16 NOTE — Discharge Instructions (Addendum)
Your gonorrhea and Chlamydia tests are pending at time of discharge.  We will contact you if you need further treatment.  Please finish your antibiotics.  Please do not drink alcohol while taking them.  Return to the emergency department if any worsening or concerning symptoms ?

## 2021-10-16 NOTE — ED Notes (Signed)
Pt changing into gown for pelvic exam

## 2021-10-16 NOTE — ED Provider Notes (Signed)
?[facility] EMERGENCY DEPARTMENT ?Provider Note ? ? ?CSN: [id number] ?Arrival date & time: 10/16/21  1155 ? ?  ? ?History ? ?Chief Complaint  ?Patient presents with  ? Exposure to STD  ? ? ?Brianna Hood is a 26 y.o. female.  She has no significant past medical history.  She said she was here about a month ago for vaginal discharge and found to be positive for trichomonas.  She was also found to be pregnant at that time.  She did not finish her treatment with the metronidazole.  She underwent a medical abortion.  Still having a little brown discharge.  Concerned because she has recurrence of her vaginal discharge smell and wonders if she needs another course of antibiotics.  Denies any sexual activity since she was last here.  No fevers chills nausea vomiting. ? ?The history is provided by the patient.  ?Vaginal Discharge ?Quality:  Manson Passey ?Severity:  Mild ?Timing:  Sporadic ?Progression:  Partially resolved ?Chronicity:  New ?Relieved by:  None tried ?Worsened by:  Nothing ?Ineffective treatments:  None tried ?Associated symptoms: no abdominal pain, no dysuria, no fever, no genital lesions, no nausea, no urinary frequency and no vomiting   ?Risk factors: terminated pregnancy   ? ?  ? ?Home Medications ?Prior to Admission medications   ?Medication Sig Start Date End Date Taking? Authorizing Provider  ?erythromycin ophthalmic ointment Place a 1/2 inch ribbon of ointment into the lower eyelid. 09/12/19   Henderly, Britni A, PA-C  ?metroNIDAZOLE (FLAGYL) 500 MG tablet Take 1 tablet (500 mg total) by mouth 2 (two) times daily. 09/04/21   Cathren Laine, MD  ?naproxen (NAPROSYN) 500 MG tablet Take 1 tablet (500 mg total) by mouth 2 (two) times daily. 08/30/17   Maczis, Elmer Sow, PA-C  ?Prenatal Vit-Fe Fumarate-FA (PRENATAL COMPLETE) 14-0.4 MG TABS Take 1 tablet by mouth daily. 10/26/14   Raymon Mutton, PA-C  ?   ? ?Allergies    ?Patient has no known allergies.   ? ?Review of Systems   ?Review of Systems   ?Constitutional:  Negative for fever.  ?Gastrointestinal:  Negative for abdominal pain, nausea and vomiting.  ?Genitourinary:  Positive for vaginal discharge. Negative for dysuria.  ? ?Physical Exam ?Updated Vital Signs ?BP 120/74 (BP Location: Right Arm)   Pulse 63   Temp 98.6 ?F (37 ?C) (Oral)   Resp 18   Ht 5\' 7"  (1.702 m)   Wt 90.7 kg   SpO2 97%   BMI 31.32 kg/m?  ?Physical Exam ?Vitals and nursing note reviewed.  ?Constitutional:   ?   General: She is not in acute distress. ?   Appearance: Normal appearance. She is well-developed.  ?HENT:  ?   Head: Normocephalic and atraumatic.  ?Eyes:  ?   Conjunctiva/sclera: Conjunctivae normal.  ?Cardiovascular:  ?   Rate and Rhythm: Normal rate and regular rhythm.  ?   Heart sounds: No murmur heard. ?Pulmonary:  ?   Effort: Pulmonary effort is normal. No respiratory distress.  ?   Breath sounds: Normal breath sounds.  ?Abdominal:  ?   Palpations: Abdomen is soft.  ?   Tenderness: There is no abdominal tenderness. There is no guarding or rebound.  ?Genitourinary: ?   Comments: Pelvic exam done with Michaela as chaperone.  Normal external genitalia.  Minimal brown discharge in vault.  Os is closed.  No cervical motion tenderness no adnexal masses or tenderness.  GC chlamydia and wet prep sent. ?Musculoskeletal:     ?  General: No swelling.  ?   Cervical back: Neck supple.  ?Skin: ?   General: Skin is warm and dry.  ?   Capillary Refill: Capillary refill takes less than 2 seconds.  ?Neurological:  ?   General: No focal deficit present.  ?   Mental Status: She is alert.  ? ? ?ED Results / Procedures / Treatments   ?Labs ?(all labs ordered are listed, but only abnormal results are displayed) ?Labs Reviewed  ?WET PREP, GENITAL - Abnormal; Notable for the following components:  ?    Result Value  ? Clue Cells Wet Prep HPF POC PRESENT (*)   ? All other components within normal limits  ?URINALYSIS, ROUTINE W REFLEX MICROSCOPIC - Abnormal; Notable for the following  components:  ? Hgb urine dipstick MODERATE (*)   ? All other components within normal limits  ?URINALYSIS, MICROSCOPIC (REFLEX) - Abnormal; Notable for the following components:  ? Bacteria, UA MANY (*)   ? All other components within normal limits  ?PREGNANCY, URINE  ?GC/CHLAMYDIA PROBE AMP ([place]) NOT AT [facility]  ? ? ?EKG ?None ? ?Radiology ?No results found. ? ?Procedures ?Procedures  ? ? ?Medications Ordered in ED ?Medications - No data to display ? ?ED Course/ Medical Decision Making/ A&P ?Clinical Course as of 10/16/21 1755  ?Fri Oct 16, 2021  ?1255 Reviewed results with patient.  She understands not to drink alcohol while taking the antibiotic. [MB]  ?  ?Clinical Course User Index ?[MB] Terrilee Files, MD  ? ?                        ?Medical Decision Making ?Amount and/or Complexity of Data Reviewed ?Labs: ordered. ? ?Risk ?Prescription drug management. ? ?This patient complains of vaginal odor; this involves an extensive number of treatment ?Options and is a complaint that carries with it a high risk of complications and ?morbidity. The differential includes trichomonas, BV, chlamydia, gonorrhea, vaginitis ? ?I ordered, reviewed and interpreted labs, which included wet prep showing clue cells ?Previous records obtained and reviewed in epic including prior ED visit ?Social determinants considered, no significant barriers ?Critical Interventions: None ? ?After the interventions stated above, I reevaluated the patient and found patient to have a benign abdominal exam, nontoxic-appearing ?Admission and further testing considered, no indications for admission or further work-up at this time.  Will cover with metronidazole.  Return instructions discussed ? ? ? ? ? ? ? ? ? ?Final Clinical Impression(s) / ED Diagnoses ?Final diagnoses:  ?BV (bacterial vaginosis)  ? ? ?Rx / DC Orders ?ED Discharge Orders   ? ?      Ordered  ?  metroNIDAZOLE (FLAGYL) 500 MG tablet  2 times daily       ? 10/16/21 1257  ? ?  ?   ? ?  ? ? ?  ?Terrilee Files, MD ?10/16/21 1757 ? ?

## 2021-10-16 NOTE — ED Triage Notes (Signed)
Pt states was treated for trichomonas last month, stopped after 3 days, She was also pregnant at that time, took abortion pill, provider recommended to not take both at same time, did not finish tx, and symptoms continue.  (Sx of vaginal discharge and odor) ?

## 2021-10-16 NOTE — ED Notes (Signed)
Urine Sample Sent To Lab ?

## 2021-10-19 LAB — GC/CHLAMYDIA PROBE AMP (~~LOC~~) NOT AT ARMC
Chlamydia: NEGATIVE
Comment: NEGATIVE
Comment: NORMAL
Neisseria Gonorrhea: NEGATIVE

## 2021-11-16 ENCOUNTER — Encounter (HOSPITAL_BASED_OUTPATIENT_CLINIC_OR_DEPARTMENT_OTHER): Payer: Self-pay | Admitting: Pediatrics

## 2021-11-16 ENCOUNTER — Emergency Department (HOSPITAL_BASED_OUTPATIENT_CLINIC_OR_DEPARTMENT_OTHER)
Admission: EM | Admit: 2021-11-16 | Discharge: 2021-11-16 | Disposition: A | Discharge status: Home / Self Care | Payer: Medicaid Other | Attending: Emergency Medicine | Admitting: Emergency Medicine

## 2021-11-16 ENCOUNTER — Emergency Department (HOSPITAL_BASED_OUTPATIENT_CLINIC_OR_DEPARTMENT_OTHER): Payer: Medicaid Other

## 2021-11-16 ENCOUNTER — Other Ambulatory Visit: Payer: Self-pay

## 2021-11-16 DIAGNOSIS — M25532 Pain in left wrist: Secondary | ICD-10-CM | POA: Insufficient documentation

## 2021-11-16 DIAGNOSIS — N649 Disorder of breast, unspecified: Secondary | ICD-10-CM | POA: Diagnosis not present

## 2021-11-16 NOTE — Discharge Instructions (Signed)
Please return to the ED for new symptoms  ?Please follow-up with your PCP for any ongoing needs.  Please call to make an appointment to be seen for any ongoing needs ?You may purchase an over-the-counter wrist splint for wrist pain as we discussed ?You may follow-up with your PCP for any ongoing needs related to the area of discoloration on your breast ?You may continue to take ibuprofen for wrist pain as well as Tylenol ?
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
## 2022-01-21 ENCOUNTER — Encounter (HOSPITAL_BASED_OUTPATIENT_CLINIC_OR_DEPARTMENT_OTHER): Payer: Self-pay | Admitting: Emergency Medicine

## 2022-01-21 ENCOUNTER — Emergency Department (HOSPITAL_BASED_OUTPATIENT_CLINIC_OR_DEPARTMENT_OTHER)
Admission: EM | Admit: 2022-01-21 | Discharge: 2022-01-21 | Disposition: A | Discharge status: Home / Self Care | Payer: Medicaid Other | Attending: Emergency Medicine | Admitting: Emergency Medicine

## 2022-01-21 DIAGNOSIS — B9689 Other specified bacterial agents as the cause of diseases classified elsewhere: Secondary | ICD-10-CM | POA: Diagnosis not present

## 2022-01-21 DIAGNOSIS — N9089 Other specified noninflammatory disorders of vulva and perineum: Secondary | ICD-10-CM

## 2022-01-21 DIAGNOSIS — N760 Acute vaginitis: Secondary | ICD-10-CM | POA: Insufficient documentation

## 2022-01-21 DIAGNOSIS — N898 Other specified noninflammatory disorders of vagina: Secondary | ICD-10-CM | POA: Diagnosis present

## 2022-01-21 LAB — URINALYSIS, ROUTINE W REFLEX MICROSCOPIC
Bilirubin Urine: NEGATIVE
Glucose, UA: NEGATIVE mg/dL
Hgb urine dipstick: NEGATIVE
Ketones, ur: NEGATIVE mg/dL
Leukocytes,Ua: NEGATIVE
Nitrite: NEGATIVE
Protein, ur: NEGATIVE mg/dL
Specific Gravity, Urine: 1.02 (ref 1.005–1.030)
pH: 7.5 (ref 5.0–8.0)

## 2022-01-21 LAB — WET PREP, GENITAL
Sperm: NONE SEEN
Trich, Wet Prep: NONE SEEN
WBC, Wet Prep HPF POC: >=10 — AB (ref <10)
Yeast Wet Prep HPF POC: NONE SEEN

## 2022-01-21 LAB — PREGNANCY, URINE: Preg Test, Ur: NEGATIVE

## 2022-01-21 MED ORDER — VALACYCLOVIR HCL 1 G PO TABS: 1000.0000 mg | ORAL_TABLET | Freq: Two times a day (BID) | ORAL | 0 refills | Status: AC

## 2022-01-21 MED ORDER — METRONIDAZOLE 500 MG PO TABS: 500.0000 mg | ORAL_TABLET | Freq: Two times a day (BID) | ORAL | 0 refills | Status: AC

## 2022-01-21 NOTE — ED Provider Notes (Signed)
[facility] EMERGENCY DEPARTMENT Provider Note   CSN: [id number] Arrival date & time: 01/21/22  2113     History  Chief Complaint  Patient presents with   Vaginal Discharge    Brianna Hood is a 26 y.o. female here for evaluation of vaginal discharge and right labial sore.  Pruritic and tender.  No history of HSV.  Discharge thin and white in color.  Denies chance of pregnancy.  No dysuria, hematuria.  She is sexually active. No abd pain, pelvic pain  HPI     Home Medications Prior to Admission medications   Medication Sig Start Date End Date Taking? Authorizing Provider  valACYclovir (VALTREX) 1000 MG tablet Take 1 tablet (1,000 mg total) by mouth 2 (two) times daily for 10 days. 01/21/22 01/31/22 Yes Emanuella Nickle A, PA-C  erythromycin ophthalmic ointment Place a 1/2 inch ribbon of ointment into the lower eyelid. 09/12/19   Eain Mullendore A, PA-C  metroNIDAZOLE (FLAGYL) 500 MG tablet Take 1 tablet (500 mg total) by mouth 2 (two) times daily. 01/21/22   Dionna Wiedemann A, PA-C  naproxen (NAPROSYN) 500 MG tablet Take 1 tablet (500 mg total) by mouth 2 (two) times daily. 08/30/17   Maczis, Elmer Sow, PA-C  Prenatal Vit-Fe Fumarate-FA (PRENATAL COMPLETE) 14-0.4 MG TABS Take 1 tablet by mouth daily. 10/26/14   Sciacca, Ashok Cordia, PA-C      Allergies    Patient has no known allergies.    Review of Systems   Review of Systems  Constitutional: Negative.   HENT: Negative.    Respiratory: Negative.    Cardiovascular: Negative.   Gastrointestinal: Negative.   Genitourinary:  Positive for vaginal discharge. Negative for decreased urine volume, difficulty urinating, dysuria, enuresis, flank pain, frequency, genital sores, hematuria, menstrual problem, pelvic pain, urgency, vaginal bleeding and vaginal pain.  Musculoskeletal: Negative.   Skin:  Positive for rash.  Neurological: Negative.   All other systems reviewed and are negative.   Physical Exam Updated Vital  Signs BP 132/81 (BP Location: Left Arm)   Pulse 79   Temp 98.5 F (36.9 C) (Oral)   Resp 18   Wt 99.8 kg   LMP 01/06/2022 (Approximate)   SpO2 99%   BMI 34.46 kg/m  Physical Exam Vitals and nursing note reviewed. Exam conducted with a chaperone present.  Constitutional:      General: She is not in acute distress.    Appearance: She is well-developed. She is not ill-appearing, toxic-appearing or diaphoretic.  HENT:     Head: Atraumatic.  Eyes:     Pupils: Pupils are equal, round, and reactive to light.  Cardiovascular:     Rate and Rhythm: Normal rate.  Pulmonary:     Effort: No respiratory distress.  Abdominal:     General: There is no distension.  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 Normal appearing cervix. Scant thin white discharge. Cervical os is closed. There is no bleeding noted at the os.no Odor. Bimanual: No CMT, non-tender.  No palpable adnexal masses or tenderness. Uterus midline and not fixed. Rectovaginal exam was deferred.  No cystocele or rectocele noted. No pelvic lymphadenopathy noted. Wet prep was obtained.  Cultures for gonorrhea and chlamydia collected. Exam performed with chaperone in room.   Musculoskeletal:        General: Normal range of motion.     Cervical back: Normal range of motion.  Skin:    General: Skin is warm  and dry.  Neurological:     General: No focal deficit present.     Mental Status: She is alert.  Psychiatric:        Mood and Affect: Mood normal.     ED Results / Procedures / Treatments   Labs (all labs ordered are listed, but only abnormal results are displayed) Labs Reviewed  WET PREP, GENITAL - Abnormal; Notable for the following components:      Result Value   Clue Cells Wet Prep HPF POC PRESENT (*)    WBC, Wet Prep HPF POC >=10 (*)    All other components within normal limits  HSV CULTURE AND TYPING  PREGNANCY, URINE  URINALYSIS, ROUTINE W REFLEX  MICROSCOPIC  GC/CHLAMYDIA PROBE AMP ([place]) NOT AT [facility]    EKG None  Radiology No results found.  Procedures Procedures    Medications Ordered in ED Medications - No data to display  ED Course/ Medical Decision Making/ A&P   26 year old here for evaluation of right labial lesions and vaginal discharge.  She is sexually active without protection.  No systemic symptoms.  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  Culture sent.  Will start on Valtrex.  Labs personally viewed and interpreted Negative for infection Pregnancy test negative Wet prep   We will have patient follow-up with results on MyChart for gonorrhea and chlamydia.  Pt presents with concerns for possible STD.  Pt understands that they have GC/Chlamydia cultures pending and that they will need to inform all sexual partners if results return positive.  Pt not concerning for PID because hemodynamically stable and no cervical motion tenderness on pelvic exam.  Patient to be discharged with instructions to follow up with OBGYN/PCP. Discussed importance of using protection when sexually active.   The patient has been appropriately medically screened and/or stabilized in the ED. I have low suspicion for any other emergent medical condition which would require further screening, evaluation or treatment in the ED or require inpatient management.  Patient is hemodynamically stable and in no acute distress.  Patient able to ambulate in department prior to ED.  Evaluation does not show acute pathology that would require ongoing or additional emergent interventions while in the emergency department or further inpatient treatment.  I have discussed the diagnosis with the patient and answered all questions.  Pain is been managed while in the emergency department and patient has no further complaints prior to discharge.  Patient is comfortable with plan discussed in room and is stable  for discharge at this time.  I have discussed strict return precautions for returning to the emergency department.  Patient was encouraged to follow-up with PCP/specialist refer to at discharge.                            Medical Decision Making Amount and/or Complexity of Data Reviewed External Data Reviewed: labs, radiology and notes. Labs: ordered. Decision-making details documented in ED Course.  Risk OTC drugs. Prescription drug management. Parenteral controlled substances. Diagnosis or treatment significantly limited by social determinants of health.          Final Clinical Impression(s) / ED Diagnoses Final diagnoses:  Labial lesion  BV (bacterial vaginosis)    Rx / DC Orders ED Discharge Orders          Ordered    valACYclovir (VALTREX) 1000 MG tablet  2 times daily  01/21/22 2231    metroNIDAZOLE (FLAGYL) 500 MG tablet  2 times daily        01/21/22 2244              Dyllen Menning A, PA-C 01/21/22 2245    CuratoloMadelaine Bhat, DO 01/21/22 2256

## 2022-01-21 NOTE — ED Triage Notes (Signed)
Pt c/o cream-brown vaginal discharge since Monday. Noticed a bump on her vagina today. Last intercourse with her partner on Tuesday. Denies pain and urinary sx.

## 2022-01-21 NOTE — Discharge Instructions (Addendum)
Results of your STD testing will be on MyChart  You do have bacterial vaginosis which is an overgrowth of the normal bacteria that lives in the vagina.  This is not an STD.  We have started you a medication called Flagyl for this.  Do not drink alcohol while taking this medication.  Your urine did not show evidence of infection  Your pregnancy test was negative  Started on Valtrex which is an antiviral medication as I have a high suspicion you have Herpes.  If this test does result negative I would still recommend retesting with blood work at your primary care office as sometimes this test is negative when the lesions are not draining

## 2022-01-25 LAB — GC/CHLAMYDIA PROBE AMP (~~LOC~~) NOT AT ARMC
Chlamydia: NEGATIVE
Comment: NEGATIVE
Comment: NORMAL
Neisseria Gonorrhea: NEGATIVE

## 2022-01-25 LAB — HSV CULTURE AND TYPING

## 2022-12-28 ENCOUNTER — Other Ambulatory Visit: Payer: Self-pay

## 2022-12-28 ENCOUNTER — Emergency Department (HOSPITAL_BASED_OUTPATIENT_CLINIC_OR_DEPARTMENT_OTHER)
Admission: EM | Admit: 2022-12-28 | Discharge: 2022-12-28 | Discharge status: Against Medical Advice | Payer: Medicaid Other | Attending: Emergency Medicine | Admitting: Emergency Medicine

## 2022-12-28 ENCOUNTER — Encounter (HOSPITAL_BASED_OUTPATIENT_CLINIC_OR_DEPARTMENT_OTHER): Payer: Self-pay

## 2022-12-28 DIAGNOSIS — M7989 Other specified soft tissue disorders: Secondary | ICD-10-CM | POA: Diagnosis present

## 2022-12-28 DIAGNOSIS — Z5321 Procedure and treatment not carried out due to patient leaving prior to being seen by health care provider: Secondary | ICD-10-CM | POA: Insufficient documentation

## 2022-12-28 NOTE — ED Triage Notes (Signed)
Pt states her left lower leg has been swollen for two weeks. States the swelling happens in both legs, goes away when she elevates. Denies [facility]

## 2023-04-13 IMAGING — US US OB < 14 WEEKS - US OB TV
1 series · 14 of 28 positions shown · non-contrast
Comparison: None.

CLINICAL DATA: Pelvic pain



[Series 1: us ob < 14 weeks - us ob tv · 14 of 47 slices shown]
[im 2/47]
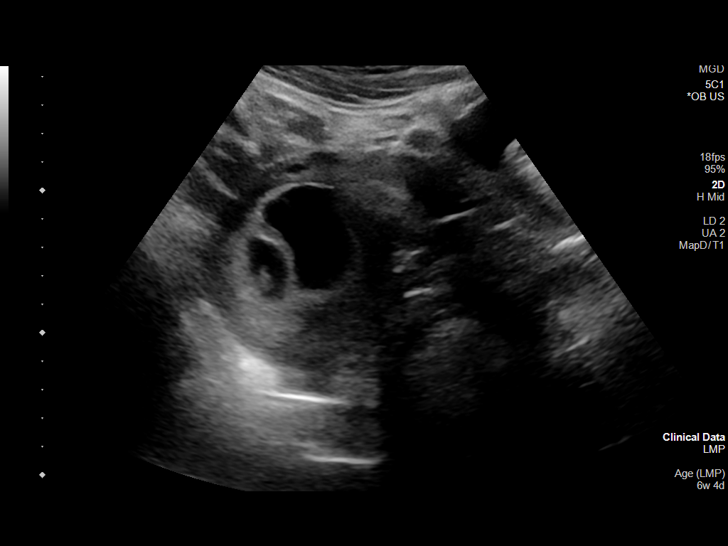
[im 6/47]
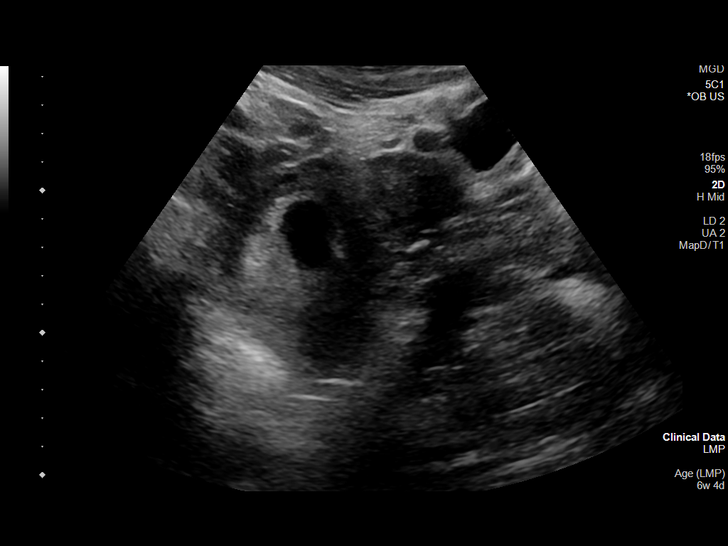
[im 9/47]
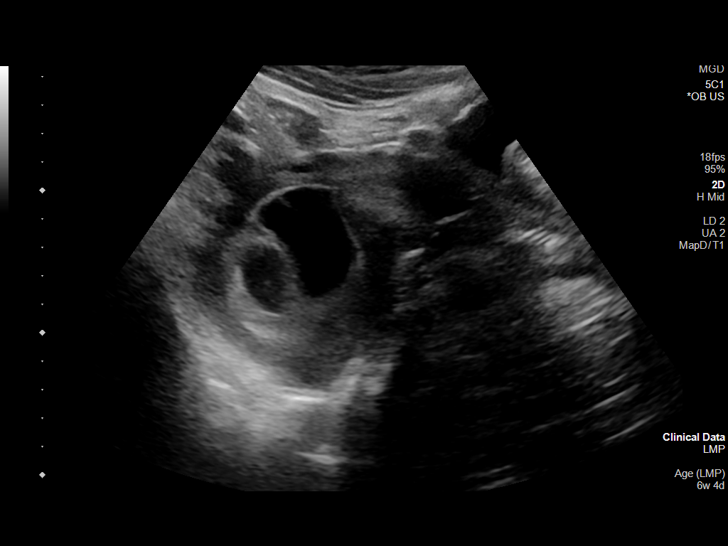
[im 12/47]
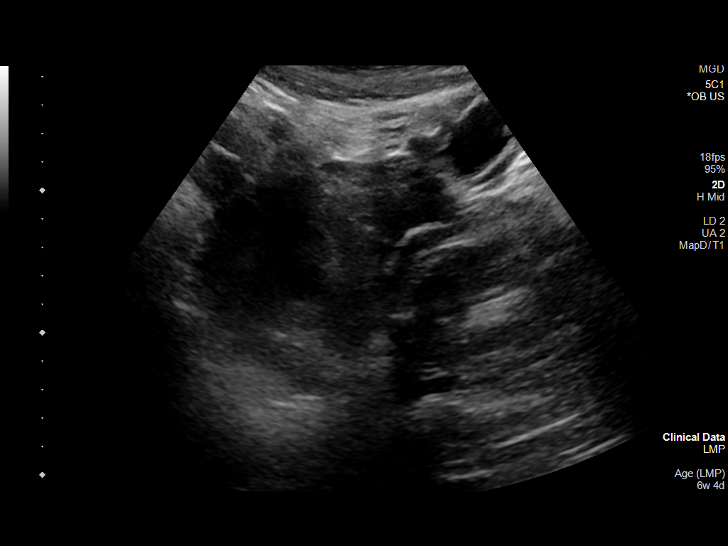
[im 16/47]
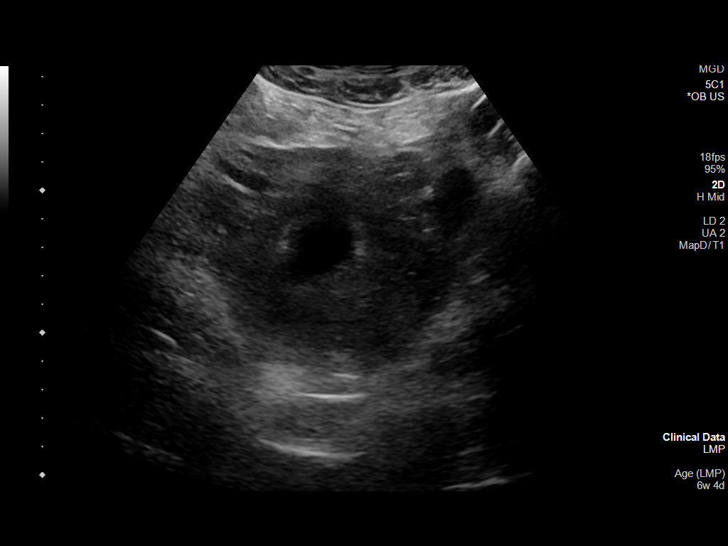
[im 19/47]
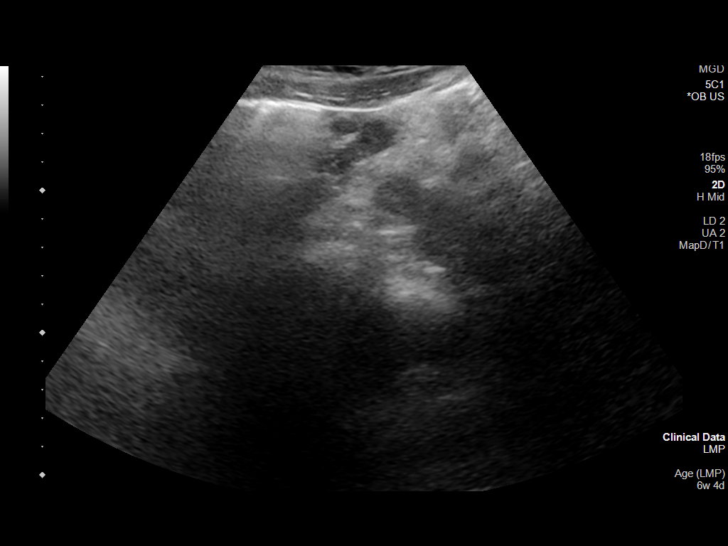
[im 23/47]
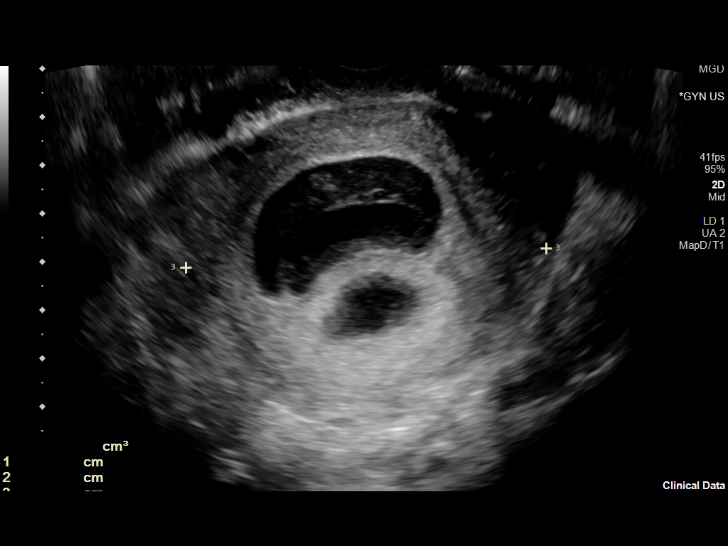
[im 26/47]
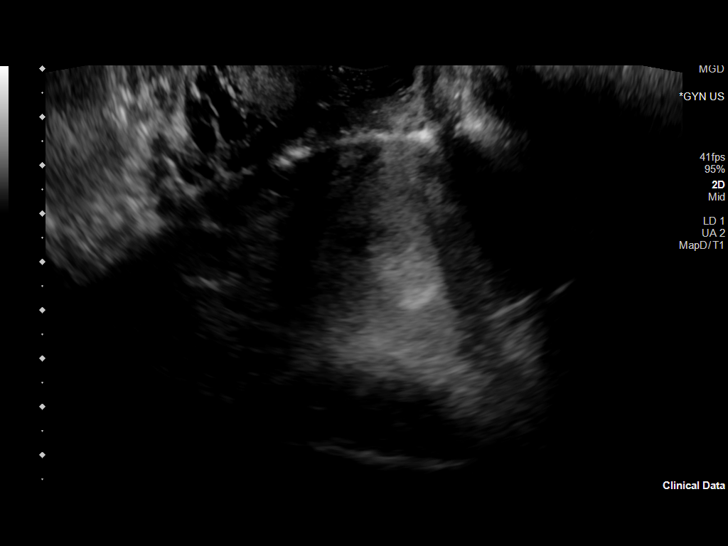
[im 29/47]
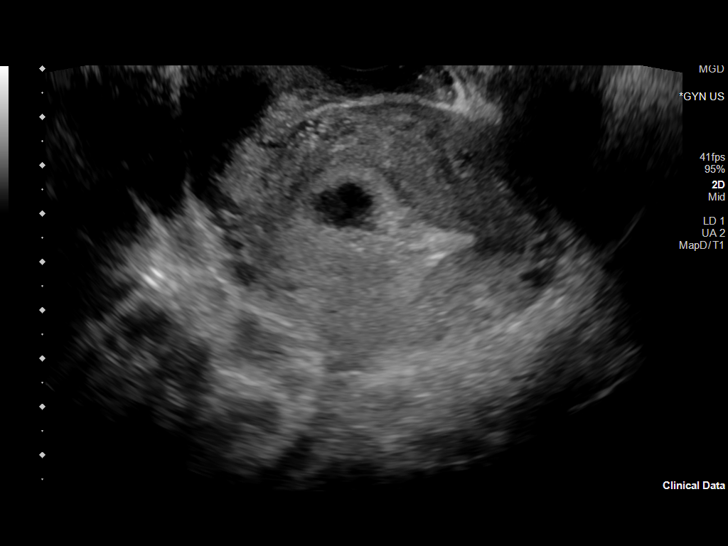
[im 33/47]
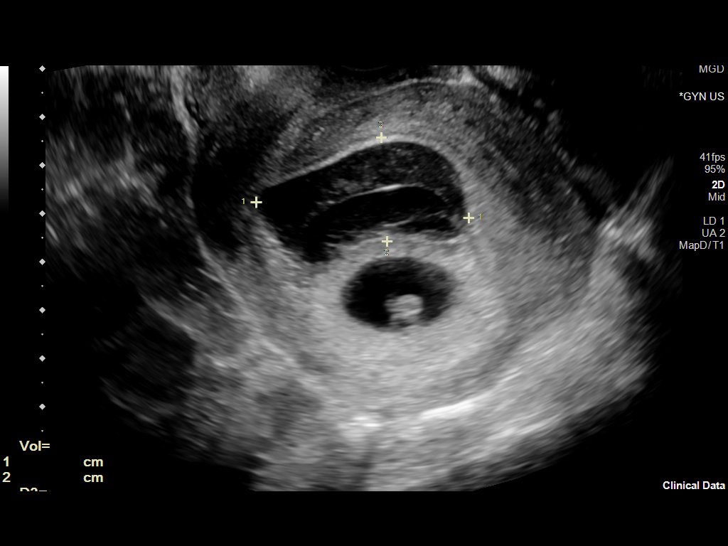
[im 36/47]
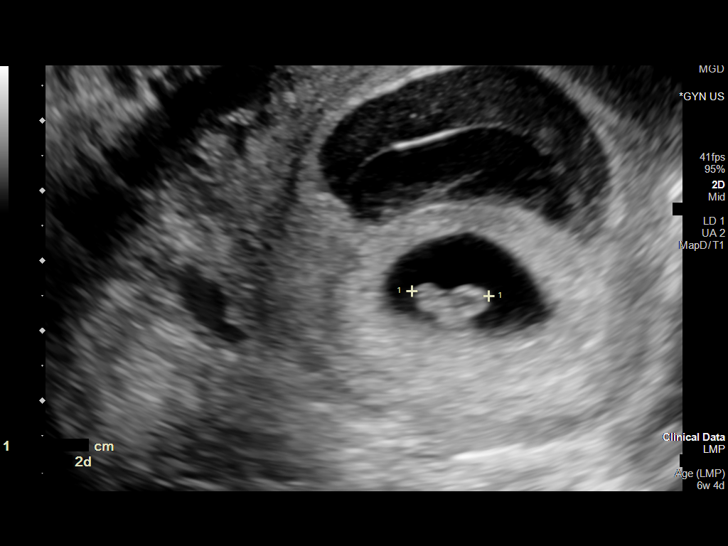
[im 40/47]
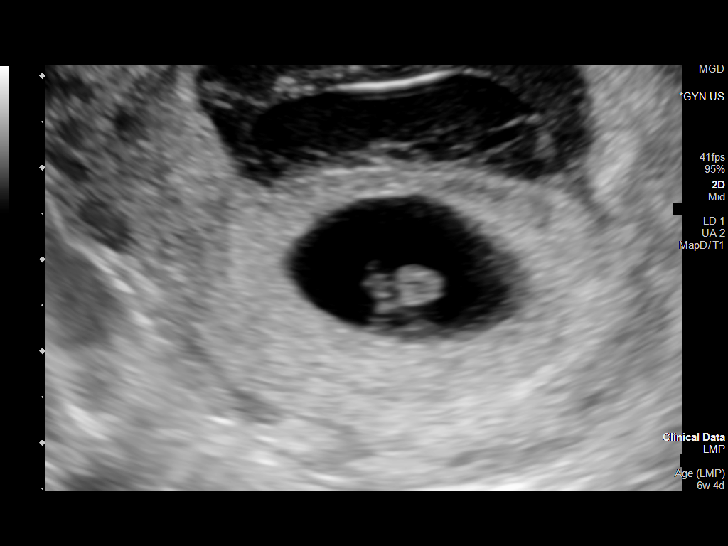
[im 43/47]
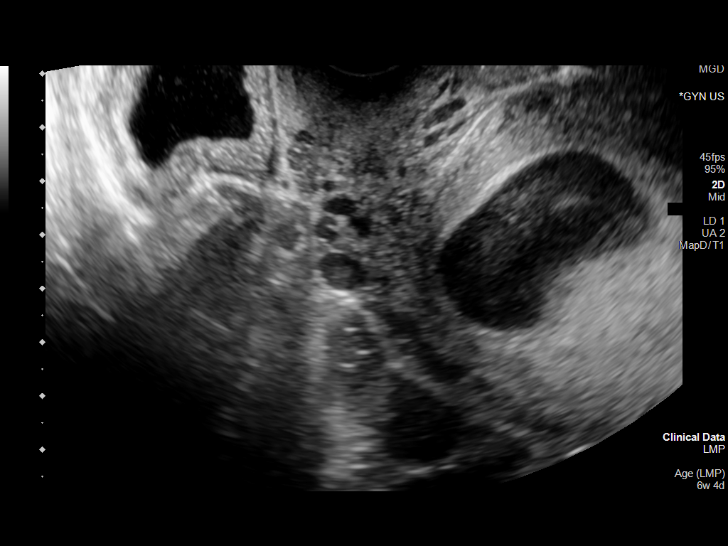
[im 47/47]
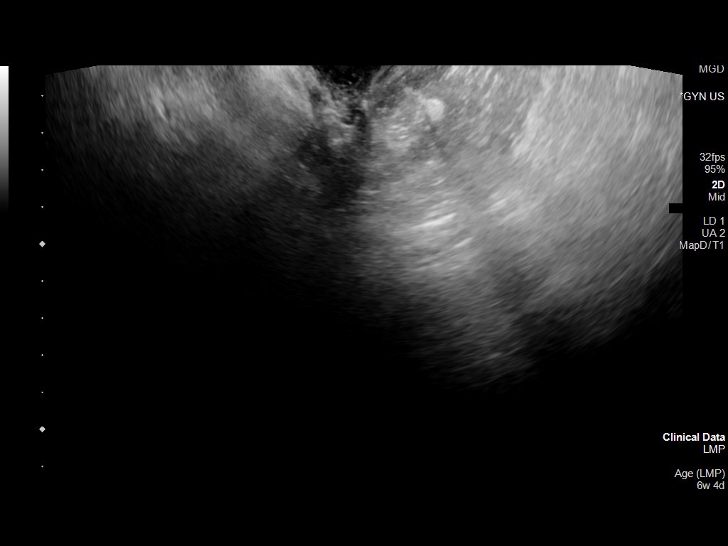

[14 of 28 positions shown; findings below may reference images not displayed]

FINDINGS: Intrauterine gestational sac: Single

Yolk sac:  Seen

Embryo:  Seen

Cardiac Activity: Seen

Heart Rate: 137 bpm

CRL:   11 mm   7 w 2 d                  US EDC: 04/21/2022

Subchorionic hemorrhage: There is moderate to large sized
subchorionic bleed measuring 4.4 x 2.2 cm

Maternal uterus/adnexae: Cervix is closed. Uterus is retroverted.
Ovaries not sonographically visualized.
IMPRESSION: Single live intrauterine pregnancy seen. Sonographically estimated
gestational age is 7 weeks 2 days.

There is moderate to large sized subchorionic bleed adjacent to the
gestational sac measuring 4.4 x 2.2 cm.

Ovaries are not sonographically visualized.

## 2024-03-10 ENCOUNTER — Emergency Department (HOSPITAL_BASED_OUTPATIENT_CLINIC_OR_DEPARTMENT_OTHER)
Admission: EM | Admit: 2024-03-10 | Discharge: 2024-03-10 | Disposition: A | Discharge status: Home / Self Care | Attending: Emergency Medicine | Admitting: Emergency Medicine

## 2024-03-10 ENCOUNTER — Other Ambulatory Visit: Payer: Self-pay

## 2024-03-10 ENCOUNTER — Encounter (HOSPITAL_BASED_OUTPATIENT_CLINIC_OR_DEPARTMENT_OTHER): Payer: Self-pay

## 2024-03-10 DIAGNOSIS — J020 Streptococcal pharyngitis: Secondary | ICD-10-CM

## 2024-03-10 DIAGNOSIS — R059 Cough, unspecified: Secondary | ICD-10-CM | POA: Diagnosis present

## 2024-03-10 DIAGNOSIS — J069 Acute upper respiratory infection, unspecified: Secondary | ICD-10-CM | POA: Diagnosis not present

## 2024-03-10 LAB — RESP PANEL BY RT-PCR (RSV, FLU A&B, COVID)  RVPGX2
Influenza A by PCR: NEGATIVE
Influenza B by PCR: NEGATIVE
Resp Syncytial Virus by PCR: NEGATIVE
SARS Coronavirus 2 by RT PCR: NEGATIVE

## 2024-03-10 LAB — GROUP A STREP BY PCR: Group A Strep by PCR: NOT DETECTED

## 2024-03-10 MED ORDER — AMOXICILLIN 500 MG PO CAPS: 500.0000 mg | ORAL_CAPSULE | Freq: Two times a day (BID) | ORAL | 0 refills | Status: AC

## 2024-03-10 NOTE — ED Provider Notes (Signed)
 [place] EMERGENCY DEPARTMENT AT [facility] Provider Note   CSN: [id number] Arrival date & time: 03/10/24  1227     Patient presents with: Sore Throat   Brianna Hood is a 28 y.o. female who presents to the ED today with primary concern of sore throat and cough over the last 4 days.  She has bilateral tenderness on the anterior neck, without any notable swelling.  She has no difficulty swallowing, denies any dyspnea, but is concerned that the symptoms have not improved over the last 4 days.  She does have some pain with swallowing, however is able to tolerate oral liquids and solids without difficulty.    Sore Throat       Prior to Admission medications   Medication Sig Start Date End Date Taking? Authorizing Provider  amoxicillin  (AMOXIL ) 500 MG capsule Take 1 capsule (500 mg total) by mouth 2 (two) times daily. 03/10/24  Yes Myriam Dorn BROCKS, PA  erythromycin  ophthalmic ointment Place a 1/2 inch ribbon of ointment into the lower eyelid. 09/12/19   Henderly, Britni A, PA-C  metroNIDAZOLE  (FLAGYL ) 500 MG tablet Take 1 tablet (500 mg total) by mouth 2 (two) times daily. 01/21/22   Henderly, Britni A, PA-C  naproxen  (NAPROSYN ) 500 MG tablet Take 1 tablet (500 mg total) by mouth 2 (two) times daily. 08/30/17   Maczis, Michael M, PA-C  Prenatal Vit-Fe Fumarate-FA (PRENATAL COMPLETE) 14-0.4 MG TABS Take 1 tablet by mouth daily. 10/26/14   Sciacca, Marissa, PA-C    Allergies: Patient has no known allergies.    Review of Systems  HENT:  Positive for sore throat.   Respiratory:  Positive for cough.   All other systems reviewed and are negative.   Updated Vital Signs BP 117/75 (BP Location: Left Arm)   Pulse 79   Temp 99.5 F (37.5 C) (Oral)   Resp 20   SpO2 100%   Physical Exam Vitals and nursing note reviewed.  Constitutional:      General: She is not in acute distress.    Appearance: She is well-developed.  HENT:     Head: Normocephalic and atraumatic.      Mouth/Throat:     Pharynx: Uvula midline. Pharyngeal swelling and posterior oropharyngeal erythema present.     Tonsils: Tonsillar exudate present.  Eyes:     Conjunctiva/sclera: Conjunctivae normal.  Cardiovascular:     Rate and Rhythm: Normal rate and regular rhythm.     Heart sounds: No murmur heard. Pulmonary:     Effort: Pulmonary effort is normal. No respiratory distress.     Breath sounds: Normal breath sounds.  Abdominal:     Palpations: Abdomen is soft.     Tenderness: There is no abdominal tenderness.  Musculoskeletal:        General: No swelling.     Cervical back: Neck supple.  Skin:    General: Skin is warm and dry.     Capillary Refill: Capillary refill takes less than 2 seconds.  Neurological:     Mental Status: She is alert.  Psychiatric:        Mood and Affect: Mood normal.     (all labs ordered are listed, but only abnormal results are displayed) Labs Reviewed  RESP PANEL BY RT-PCR (RSV, FLU A&B, COVID)  [id number]  GROUP A STREP BY PCR    EKG: None  Radiology: No results found.   Procedures   Medications Ordered in the ED - No data to display  Medical Decision Making Risk Prescription drug management.   Given her symptoms and presentation, respiratory panel nasopharyngeal swab was obtained to evaluate for flu/COVID/RSV.  Further, obtain a strep swab to evaluate for streptococcal pharyngitis.  Both of these are negative.  However in light of profound edema and tonsillar exudates on physical exam along with a history of odynophagia with persistent sore throat, will begin empiric management for possible streptococcal pharyngitis at this time.  As she has no apparent PTA, is able to tolerate liquids and solids without difficulty, will discharge patient with outpatient course of of amoxicillin  to manage likely streptococcal pharyngitis.  This is explained to the patient, she understands and agrees has no further concerns at  this time.     Final diagnoses:  Strep pharyngitis  Viral upper respiratory tract infection    ED Discharge Orders          Ordered    amoxicillin  (AMOXIL ) 500 MG capsule  2 times daily        03/10/24 1344               Myriam Dorn BROCKS, [place] 03/10/24 1706    Charlyn Sora, MD 03/11/24 [phone]

## 2024-03-10 NOTE — ED Notes (Signed)
 D/c paperwork reviewed with pt, including prescriptions and follow up care.  All questions and/or concerns addressed at time of d/c.  No further needs expressed. . Pt verbalized understanding, Ambulatory without assistance to ED exit, NAD.

## 2024-03-10 NOTE — Discharge Instructions (Signed)
 As discussed, your strep test was negative however the physical exam did show swelling and discharge on the tonsils that with your symptoms is consistent with streptococcal pharyngitis.  As such we will begin to treat for this, continue to take the prescribed antibiotics for the full course even if you begin to feel better, and follow-up with your primary care provider within the next 2 weeks to assess your progress.

## 2024-03-10 NOTE — ED Triage Notes (Signed)
 Sore throat, cough for 4 days. Reports tenderness on sides of neck.   Denies difficulty breathing
